# Patient Record
Sex: Female | Born: 1990 | Race: Black or African American | Hispanic: No | Marital: Single | Smoking: Never smoker
Health system: Southern US, Community
[De-identification: ages and names within clinical notes are randomized; demographics above are authoritative.]

---
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
## 2010-12-24 LAB — URINALYSIS, ROUTINE W REFLEX MICROSCOPIC
Glucose, UA: NEGATIVE
Leukocytes, UA: NEGATIVE
Nitrite: NEGATIVE
Protein, ur: NEGATIVE

## 2010-12-24 LAB — DIFFERENTIAL
Basophils Relative: 0
Lymphs Abs: 1.5
Monocytes Relative: 15 — ABNORMAL HIGH
Neutro Abs: 4.4
Neutrophils Relative %: 62

## 2010-12-24 LAB — URINE MICROSCOPIC-ADD ON

## 2010-12-24 LAB — BASIC METABOLIC PANEL
Calcium: 8.8
Creatinine, Ser: 0.95

## 2010-12-24 LAB — CBC
MCHC: 33.8
Platelets: 276
RBC: 4.13
WBC: 7

## 2010-12-24 LAB — PREGNANCY, URINE: Preg Test, Ur: NEGATIVE

## 2011-12-02 ENCOUNTER — Encounter: Payer: Self-pay | Admitting: Family Medicine

## 2011-12-02 ENCOUNTER — Ambulatory Visit (INDEPENDENT_AMBULATORY_CARE_PROVIDER_SITE_OTHER): Payer: 59 | Admitting: Family Medicine

## 2011-12-02 ENCOUNTER — Ambulatory Visit
Admission: RE | Admit: 2011-12-02 | Discharge: 2011-12-02 | Disposition: A | Discharge status: Home / Self Care | Payer: 59 | Source: Ambulatory Visit | Attending: Family Medicine | Admitting: Family Medicine

## 2011-12-02 VITALS — BP 120/82 | HR 72 | Ht 67.5 in | Wt 273.0 lb

## 2011-12-02 DIAGNOSIS — M25569 Pain in unspecified knee: Secondary | ICD-10-CM

## 2011-12-02 DIAGNOSIS — M542 Cervicalgia: Secondary | ICD-10-CM

## 2011-12-02 DIAGNOSIS — M25579 Pain in unspecified ankle and joints of unspecified foot: Secondary | ICD-10-CM

## 2011-12-02 DIAGNOSIS — M25562 Pain in left knee: Secondary | ICD-10-CM

## 2011-12-02 DIAGNOSIS — M25571 Pain in right ankle and joints of right foot: Secondary | ICD-10-CM

## 2011-12-02 NOTE — Patient Instructions (Signed)
Continue ice, elevation. I recommend use of anti-inflammatory such as aleve or ibuprofen (advil, motrin) as needed for pain and swelling.  Get x-rays today of neck to ensure no bony injury given tenderness to your spine on exam.  You can go to [facility]
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
## 2014-03-26 ENCOUNTER — Encounter: Payer: Self-pay | Admitting: Medical

## 2014-03-26 ENCOUNTER — Ambulatory Visit (INDEPENDENT_AMBULATORY_CARE_PROVIDER_SITE_OTHER): Payer: 59 | Admitting: Medical

## 2014-03-26 ENCOUNTER — Ambulatory Visit
Admission: RE | Admit: 2014-03-26 | Discharge: 2014-03-26 | Disposition: A | Discharge status: Home / Self Care | Payer: 59 | Source: Ambulatory Visit | Attending: Medical | Admitting: Medical

## 2014-03-26 VITALS — BP 112/80 | HR 72 | Temp 98.5°F | Resp 16 | Wt 296.0 lb

## 2014-03-26 DIAGNOSIS — M545 Low back pain, unspecified: Secondary | ICD-10-CM

## 2014-03-26 DIAGNOSIS — M25512 Pain in left shoulder: Secondary | ICD-10-CM

## 2014-03-26 DIAGNOSIS — M542 Cervicalgia: Secondary | ICD-10-CM

## 2014-03-26 MED ORDER — CYCLOBENZAPRINE HCL 10 MG PO TABS: ORAL_TABLET | ORAL | Status: DC

## 2014-03-26 MED ORDER — NAPROXEN 375 MG PO TABS: 375.0000 mg | ORAL_TABLET | Freq: Two times a day (BID) | ORAL | Status: DC
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
## 2014-04-01 ENCOUNTER — Telehealth: Payer: Self-pay | Admitting: Medical

## 2014-04-01 NOTE — Telephone Encounter (Signed)
Pt states med isn't helping her back at all, still having really hard time with constant back pain, and she toss and turn at night, can't sleep.  What do you recommend?

## 2014-04-01 NOTE — Telephone Encounter (Signed)
Assuming she is using the muscle relaxer at bedtime and naprosyn and getting no relief, may need to recheck/re-examine.   Lets have her come back, and will likely refer to PT, but I want to re-examine her

## 2014-04-01 NOTE — Telephone Encounter (Signed)
Pt called back and made an appt for tomorrow

## 2014-04-02 ENCOUNTER — Telehealth: Payer: Self-pay | Admitting: Medical

## 2014-04-02 ENCOUNTER — Encounter: Payer: Self-pay | Admitting: Medical

## 2014-04-02 ENCOUNTER — Ambulatory Visit (INDEPENDENT_AMBULATORY_CARE_PROVIDER_SITE_OTHER): Payer: 59 | Admitting: Medical

## 2014-04-02 VITALS — BP 120/82 | HR 79 | Temp 98.1°F | Resp 16 | Wt 297.0 lb

## 2014-04-02 DIAGNOSIS — M545 Low back pain, unspecified: Secondary | ICD-10-CM

## 2014-04-02 DIAGNOSIS — M542 Cervicalgia: Secondary | ICD-10-CM

## 2014-04-02 DIAGNOSIS — M25512 Pain in left shoulder: Secondary | ICD-10-CM

## 2014-04-02 MED ORDER — TRAMADOL HCL 50 MG PO TABS: 50.0000 mg | ORAL_TABLET | Freq: Three times a day (TID) | ORAL | Status: AC | PRN

## 2014-04-02 MED ORDER — NAPROXEN 375 MG PO TABS: 375.0000 mg | ORAL_TABLET | Freq: Two times a day (BID) | ORAL | Status: AC

## 2014-04-02 NOTE — Telephone Encounter (Signed)
Ok, that is also a good option
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
## 2014-04-02 NOTE — Telephone Encounter (Signed)
She doesn't want PT anymore. She wants to go see a [facility].

## 2014-04-02 NOTE — Telephone Encounter (Signed)
Pt called to let us know that she has decided not to go to PT. She will be going to a [facility] in [place] instead

## 2014-04-02 NOTE — Telephone Encounter (Signed)
I spoke with the patient to ask if she could help me locate a place in [place] that does PT near the [facility] [address] and call me back to let me know. Patient states that she will work on that and get back with me.

## 2014-04-02 NOTE — Telephone Encounter (Signed)
Refer to physical therapy.  [place] [address]

## 2014-07-09 ENCOUNTER — Telehealth: Payer: Self-pay | Admitting: Internal Medicine

## 2014-07-09 NOTE — Telephone Encounter (Signed)
Faxed over medical records to [facility] @[phone] on march 23,2016
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
## 2022-09-23 DIAGNOSIS — I10 Essential (primary) hypertension: Secondary | ICD-10-CM | POA: Diagnosis not present

## 2022-09-23 DIAGNOSIS — M25562 Pain in left knee: Secondary | ICD-10-CM | POA: Diagnosis not present

## 2022-09-28 DIAGNOSIS — M170 Bilateral primary osteoarthritis of knee: Secondary | ICD-10-CM | POA: Diagnosis not present

## 2022-10-19 DIAGNOSIS — M170 Bilateral primary osteoarthritis of knee: Secondary | ICD-10-CM | POA: Diagnosis not present

## 2022-10-21 DIAGNOSIS — I10 Essential (primary) hypertension: Secondary | ICD-10-CM | POA: Diagnosis not present

## 2022-11-25 DIAGNOSIS — M170 Bilateral primary osteoarthritis of knee: Secondary | ICD-10-CM | POA: Diagnosis not present

## 2023-02-28 DIAGNOSIS — Z130 Encounter for screening for diseases of the blood and blood-forming organs and certain disorders involving the immune mechanism: Secondary | ICD-10-CM | POA: Diagnosis not present

## 2023-02-28 DIAGNOSIS — Z131 Encounter for screening for diabetes mellitus: Secondary | ICD-10-CM | POA: Diagnosis not present

## 2023-02-28 DIAGNOSIS — Z0000 Encounter for general adult medical examination without abnormal findings: Secondary | ICD-10-CM | POA: Diagnosis not present

## 2023-02-28 DIAGNOSIS — Z1329 Encounter for screening for other suspected endocrine disorder: Secondary | ICD-10-CM | POA: Diagnosis not present

## 2023-03-10 DIAGNOSIS — R112 Nausea with vomiting, unspecified: Secondary | ICD-10-CM | POA: Diagnosis not present

## 2023-03-10 DIAGNOSIS — E876 Hypokalemia: Secondary | ICD-10-CM | POA: Diagnosis not present

## 2023-03-10 DIAGNOSIS — R079 Chest pain, unspecified: Secondary | ICD-10-CM | POA: Diagnosis not present

## 2023-03-10 DIAGNOSIS — N289 Disorder of kidney and ureter, unspecified: Secondary | ICD-10-CM | POA: Diagnosis not present

## 2023-03-10 DIAGNOSIS — R1013 Epigastric pain: Secondary | ICD-10-CM | POA: Diagnosis not present
